# Patient Record
Sex: Female | Born: 1970 | Race: Black or African American | Hispanic: No | Marital: Married | Smoking: Never smoker
Health system: Southern US, Community
[De-identification: ages and names within clinical notes are randomized; demographics above are authoritative.]

---

## 2016-07-19 ENCOUNTER — Emergency Department (HOSPITAL_COMMUNITY)
Admission: EM | Admit: 2016-07-19 | Discharge: 2016-07-19 | Disposition: A | Discharge status: Home / Self Care | Payer: No Typology Code available for payment source | Attending: Emergency Medicine | Admitting: Emergency Medicine

## 2016-07-19 ENCOUNTER — Emergency Department (HOSPITAL_COMMUNITY): Payer: No Typology Code available for payment source

## 2016-07-19 ENCOUNTER — Encounter (HOSPITAL_COMMUNITY): Payer: Self-pay | Admitting: Nurse Practitioner

## 2016-07-19 DIAGNOSIS — Y939 Activity, unspecified: Secondary | ICD-10-CM | POA: Diagnosis not present

## 2016-07-19 DIAGNOSIS — Z23 Encounter for immunization: Secondary | ICD-10-CM | POA: Insufficient documentation

## 2016-07-19 DIAGNOSIS — S61421A Laceration with foreign body of right hand, initial encounter: Secondary | ICD-10-CM

## 2016-07-19 DIAGNOSIS — Y999 Unspecified external cause status: Secondary | ICD-10-CM | POA: Diagnosis not present

## 2016-07-19 DIAGNOSIS — M546 Pain in thoracic spine: Secondary | ICD-10-CM | POA: Insufficient documentation

## 2016-07-19 DIAGNOSIS — S0083XA Contusion of other part of head, initial encounter: Secondary | ICD-10-CM | POA: Diagnosis not present

## 2016-07-19 DIAGNOSIS — Y92410 Unspecified street and highway as the place of occurrence of the external cause: Secondary | ICD-10-CM | POA: Diagnosis not present

## 2016-07-19 DIAGNOSIS — S60811A Abrasion of right wrist, initial encounter: Secondary | ICD-10-CM | POA: Diagnosis not present

## 2016-07-19 DIAGNOSIS — M542 Cervicalgia: Secondary | ICD-10-CM | POA: Insufficient documentation

## 2016-07-19 DIAGNOSIS — S6991XA Unspecified injury of right wrist, hand and finger(s), initial encounter: Secondary | ICD-10-CM | POA: Diagnosis present

## 2016-07-19 LAB — POC URINE PREG, ED: Preg Test, Ur: NEGATIVE

## 2016-07-19 MED ORDER — LIDOCAINE HCL (PF) 1 % IJ SOLN
10.0000 mL | Freq: Once | INTRAMUSCULAR | Status: AC
  Administered 2016-07-19: 10 mL via INTRADERMAL
  Filled 2016-07-19: qty 10

## 2016-07-19 MED ORDER — TETANUS-DIPHTH-ACELL PERTUSSIS 5-2.5-18.5 LF-MCG/0.5 IM SUSP
0.5000 mL | Freq: Once | INTRAMUSCULAR | Status: AC
  Administered 2016-07-19: 0.5 mL via INTRAMUSCULAR
  Filled 2016-07-19: qty 0.5
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
## 2016-07-19 NOTE — ED Notes (Signed)
Pt back from x-ray.
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
